# Patient Record
Sex: Female | Born: 1980 | Race: Black or African American | Hispanic: No | Marital: Married | Smoking: Never smoker
Health system: Southern US, Community
[De-identification: ages and names within clinical notes are randomized; demographics above are authoritative.]

## PROBLEM LIST (undated history)

## (undated) DIAGNOSIS — E079 Disorder of thyroid, unspecified: Secondary | ICD-10-CM

## (undated) HISTORY — PX: ABDOMINAL HYSTERECTOMY: SHX81

## (undated) HISTORY — PX: TUBAL LIGATION: SHX77

## (undated) HISTORY — DX: Disorder of thyroid, unspecified: E07.9

## (undated) HISTORY — PX: BREAST SURGERY: SHX581

---

## 2000-03-15 ENCOUNTER — Ambulatory Visit (HOSPITAL_BASED_OUTPATIENT_CLINIC_OR_DEPARTMENT_OTHER): Admission: RE | Admit: 2000-03-15 | Discharge: 2000-03-15 | Payer: Self-pay | Admitting: Plastic Surgery

## 2000-03-15 ENCOUNTER — Encounter (INDEPENDENT_AMBULATORY_CARE_PROVIDER_SITE_OTHER): Payer: Self-pay | Admitting: *Deleted

## 2009-12-07 ENCOUNTER — Emergency Department (HOSPITAL_BASED_OUTPATIENT_CLINIC_OR_DEPARTMENT_OTHER): Admission: EM | Admit: 2009-12-07 | Discharge: 2009-12-07 | Payer: Self-pay | Admitting: Emergency Medicine

## 2009-12-07 ENCOUNTER — Emergency Department (HOSPITAL_COMMUNITY): Admission: EM | Admit: 2009-12-07 | Discharge: 2009-12-07 | Payer: Self-pay | Admitting: Emergency Medicine

## 2009-12-07 ENCOUNTER — Ambulatory Visit: Payer: Self-pay | Admitting: Vascular Surgery

## 2010-08-22 NOTE — Op Note (Signed)
[place]  Patient:    Katherine Reese, Katherine Reese                       MRN: [id number] Proc. Date: 03/15/00 Adm. Date:  [id number] Attending:  Gustavus Messing                           Operative Report  INDICATIONS:  A 30 year old lady who was admitted to hospital surgical center today for reduction of breasts.  She has severe macromastia, back and shoulder pain secondary to large pendulous breasts.  SURGEON:  Yaakov Guthrie. Shon Hough, M.D.  ASSISTANT:  Margaretha Sheffield, R.N.  PROCEDURE:  The patient was set up and drawn for the inferior pedicle reduction mammoplasty, remarking the nipple areolar complexes from over 40 cm up to 20.  She underwent general anesthesia, intubated orally.  Prep was done to the chest and breast areas in the routine fashion using Betadine soap and solution, walled off with sterile towels, and draped so as to make a sterile field.  Xylocaine 0.25% was injected locally, 150 cc per side.  The wounds were scored with a #15 blade then the skin over the inferior pedicle was de-epithelialized with a #20 blade out laterally.  The mediolateral flaps were taken off down to the fascia.  The new keyhole area was also debulked.  Excess breast tissue and accessory breast tissue was taken off laterally over the serratus anterior latissimus dorsi areas and above the axillary areas.  After proper hemostasis, the flaps were transposed and stayed with 3-0 Prolene. Subcutaneous closure was done with 3-0 Monocryl x 2 layers, and then a running subcuticular stitch of 3.0 Monocryl and 5-0 Monocryl throughout the inverted T.  The wounds were drained with #10 Blake drains which were placed in the depths of the wound above the lateral-most portion of the incision, secured with 3-0 Prolene.  The same procedure was carried out on both sides, removing over 1500 g per side.  At the end of the procedure, the nipple areolar complexes were examined with good  nourishment and blood supply.  The wounds were cleansed.  Steri-Strips and soft dressing were applied, including Xeroform, 4x4s, ABDs, and Hypafix tape.  She withstood the procedures very well, was taken to the recovery room in good condition.  ESTIMATED BLOOD LOSS:  100 cc.  COMPLICATIONS:  None. DD:  03/15/00 TD:  03/15/00 Job: [id number]

## 2014-04-24 ENCOUNTER — Ambulatory Visit (HOSPITAL_BASED_OUTPATIENT_CLINIC_OR_DEPARTMENT_OTHER)
Admission: RE | Admit: 2014-04-24 | Discharge: 2014-04-24 | Disposition: A | Discharge status: Home / Self Care | Payer: Managed Care, Other (non HMO) | Source: Ambulatory Visit | Attending: Physician Assistant | Admitting: Physician Assistant

## 2014-04-24 ENCOUNTER — Other Ambulatory Visit: Payer: Self-pay | Admitting: Physician Assistant

## 2014-04-24 ENCOUNTER — Ambulatory Visit (INDEPENDENT_AMBULATORY_CARE_PROVIDER_SITE_OTHER): Payer: Managed Care, Other (non HMO)

## 2014-04-24 ENCOUNTER — Encounter: Payer: Self-pay | Admitting: Physician Assistant

## 2014-04-24 ENCOUNTER — Ambulatory Visit (INDEPENDENT_AMBULATORY_CARE_PROVIDER_SITE_OTHER): Payer: Managed Care, Other (non HMO) | Admitting: Physician Assistant

## 2014-04-24 VITALS — BP 104/70 | HR 77 | Temp 98.7°F | Ht 64.0 in | Wt 241.0 lb

## 2014-04-24 DIAGNOSIS — M79673 Pain in unspecified foot: Secondary | ICD-10-CM | POA: Insufficient documentation

## 2014-04-24 DIAGNOSIS — M79671 Pain in right foot: Secondary | ICD-10-CM

## 2014-04-24 DIAGNOSIS — K219 Gastro-esophageal reflux disease without esophagitis: Secondary | ICD-10-CM | POA: Insufficient documentation

## 2014-04-24 DIAGNOSIS — B001 Herpesviral vesicular dermatitis: Secondary | ICD-10-CM | POA: Insufficient documentation

## 2014-04-24 MED ORDER — MELOXICAM 15 MG PO TABS: 15.0000 mg | ORAL_TABLET | Freq: Every day | ORAL | Status: DC

## 2014-04-24 NOTE — Progress Notes (Signed)
   Subjective:    Patient ID: Katherine Reese, female    DOB: 05-03-1980, 34 y.o.   MRN: [id number]  HPI  Patient is a 34 year old female who presents to the clinic to establish care.  .. Active Ambulatory Problems    Diagnosis Date Noted  . Fever blister 04/24/2014  . GERD (gastroesophageal reflux disease) 04/24/2014  . Heel pain 04/24/2014   Resolved Ambulatory Problems    Diagnosis Date Noted  . No Resolved Ambulatory Problems   Past Medical History  Diagnosis Date  . Thyroid disease    .Marland Kitchen. Family History  Problem Relation Age of Onset  . Hyperlipidemia Mother   . Diabetes Maternal Grandmother   . Hyperlipidemia Maternal Grandmother   . Kidney disease Maternal Grandmother   . Diabetes Maternal Grandfather   . Hyperlipidemia Maternal Grandfather   . Kidney disease Maternal Grandfather    .Marland Kitchen. History   Social History  . Marital Status: Married    Spouse Name: N/A    Number of Children: N/A  . Years of Education: N/A   Occupational History  . Not on file.   Social History Main Topics  . Smoking status: Never Smoker   . Smokeless tobacco: Never Used  . Alcohol Use: No  . Drug Use: No  . Sexual Activity: Yes   Other Topics Concern  . Not on file   Social History Narrative  . No narrative on file   Patient does not need refills on her ongoing medical conditions of GERD and fever blisters.  She does have one concern today with right heel pain. This is been ongoing for over a year. Some days it is worse than others. She has tried icing and and ibuprofen with little to no relief. Nothing in particular makes worse. Nothing specific makes better. She denies any injury. She denies any calf pain. She denies any pain over the fascia. She did have some anterior foot pain at one time but it has since resolved.    Review of Systems  All other systems reviewed and are negative.      Objective:   Physical Exam  Constitutional: She is oriented to person, place, and  time. She appears well-developed and well-nourished.  HENT:  Head: Normocephalic and atraumatic.  Cardiovascular: Normal rate, regular rhythm and normal heart sounds.   Pulmonary/Chest: Effort normal and breath sounds normal.  Musculoskeletal:  Normal ROM and strength of right foot.  No pain over anterior foot.  No pain over right fascia. Pain to palpation over right heel. No pain over Achilles tendon and up right calf.   Neurological: She is alert and oriented to person, place, and time.  Skin: Skin is dry.  Psychiatric: She has a normal mood and affect. Her behavior is normal.          Assessment & Plan:  Fever blisters-controlled with antiviral daily.  GERD-controlled with Nexium daily.  Right help pain-I suspect he'll spur to be causing pain. Will get x-ray today. I do think patient would benefit from orthotics. If x-ray confirms spur we can refer to Dr. Karie Schwalbe for orthotics. I did give patient a stronger anti-inflammatory Modic to take as needed for inflammation. I did give a handout. Encouraged patient to ice. Will follow-up.

## 2014-04-24 NOTE — Patient Instructions (Signed)
Heel Spur A heel spur is a hook of bone that can form on the calcaneus (the heel bone and the largest bone of the foot). Heel spurs are often associated with plantar fasciitis and usually come in people who have had the problem for an extended period of time. The cause of the relationship is unknown. The pain associated with them is thought to be caused by an inflammation (soreness and redness) of the plantar fascia rather than the spur itself. The plantar fascia is a thick fibrous like tissue that runs from the calcaneus (heel bone) to the ball of the foot. This strong, tight tissue helps maintain the arch of your foot. It helps distribute the weight across your foot as you walk or run. Stresses placed on the plantar fascia can be tremendous. When it is inflamed normal activities become painful. Pain is worse in the morning after sleeping. After sleeping the plantar fascia is tight. The first movements stretch the fascia and this causes pain. As the tendon loosens, the pain usually gets better. It often returns with too much standing or walking.  About 70% of patients with plantar fasciitis have a heel spur. About half of people without foot pain also have heel spurs. DIAGNOSIS  The diagnosis of a heel spur is made by X-ray. The X-ray shows a hook of bone protruding from the bottom of the calcaneus at the point where the plantar fascia is attached to the heel bone.  TREATMENT  It is necessary to find out what is causing the stretching of the plantar fascia. If the cause is over-pronation (flat feet), orthotics and proper foot ware may help.  Stretching exercises, losing weight, wearing shoes that have a cushioned heel that absorbs shock, and elevating the heel with the use of a heel cradle, heel cup, or orthotics may all help. Heel cradles and heel cups provide extra comfort and cushion to the heel, and reduce the amount of shock to the sore area. AVOIDING THE PAIN OF PLANTAR FASCIITIS AND HEEL  SPURS  Consult a sports medicine professional before beginning a new exercise program.  Walking programs offer a good workout. There is a lower chance of overuse injuries common to the runners. There is less impact and less jarring of the joints.  Begin all new exercise programs slowly. If problems or pains develop, decrease the amount of time or distance until you are at a comfortable level.  Wear good shoes and replace them regularly.  Stretch your foot and the heel cords at the back of the ankle (Achilles tendons) both before and after exercise.  Run or exercise on even surfaces that are not hard. For example, asphalt is better than pavement.  Do not run barefoot on hard surfaces.  If using a treadmill, vary the incline.  Do not continue to workout if you have foot or joint problems. Seek professional help if they do not improve. HOME CARE INSTRUCTIONS   Avoid activities that cause you pain until you recover.  Use ice or cold packs to the problem or painful areas after working out.  Only take over-the-counter or prescription medicines for pain, discomfort, or fever as directed by your caregiver.  Soft shoe inserts or athletic shoes with air or gel sole cushions may be helpful.  If problems continue or become more severe, consult a sports medicine caregiver. Cortisone is a potent anti-inflammatory medication that may be injected into the painful area. You can discuss this treatment with your caregiver. MAKE SURE YOU:     Understand these instructions.  Will watch your condition.  Will get help right away if you are not doing well or get worse. Document Released: 04/29/2005 Document Revised: 06/15/2011 Document Reviewed: 05/24/2013 ExitCare Patient Information 2015 [employer]. This information is not intended to replace advice given to you by your health care provider. Make sure you discuss any questions you have with your health care provider.  

## 2014-04-25 ENCOUNTER — Encounter: Payer: Self-pay | Admitting: Physician Assistant

## 2014-04-25 DIAGNOSIS — M722 Plantar fascial fibromatosis: Secondary | ICD-10-CM | POA: Insufficient documentation

## 2014-05-04 ENCOUNTER — Telehealth: Payer: Self-pay | Admitting: *Deleted

## 2014-05-04 NOTE — Telephone Encounter (Signed)
Pt left vm stating that the meloxicam has given her bad diarrhea.  Not taken in 2 days & diarrhea is better.

## 2014-05-07 NOTE — Telephone Encounter (Signed)
Patient advised and transferred to the front for scheduling.

## 2014-05-07 NOTE — Telephone Encounter (Signed)
Ok. Can use aleve or ibuprofen OTC as well. We had mentioned othrothics from Dr. Karie Schwalbe. See if she would like to schedule appt.

## 2014-05-15 ENCOUNTER — Ambulatory Visit (INDEPENDENT_AMBULATORY_CARE_PROVIDER_SITE_OTHER): Payer: Managed Care, Other (non HMO) | Admitting: Sports Medicine

## 2014-05-15 ENCOUNTER — Encounter: Payer: Self-pay | Admitting: Sports Medicine

## 2014-05-15 DIAGNOSIS — M722 Plantar fascial fibromatosis: Secondary | ICD-10-CM

## 2014-05-15 MED ORDER — DICLOFENAC SODIUM 75 MG PO TBEC
75.0000 mg | DELAYED_RELEASE_TABLET | Freq: Two times a day (BID) | ORAL | Status: DC
Start: 2014-05-15 — End: 2016-05-29

## 2014-05-15 NOTE — Assessment & Plan Note (Signed)
Custom orthotics as above. Home rehabilitation exercises given, switching to Voltaren. Return in a month, injection if no better.

## 2014-05-15 NOTE — Progress Notes (Signed)
   Subjective:    I'm seeing this patient as a consultation for:  Tandy GawJade Breeback, PA-C  CC: right foot pain  HPI: For sometime now this pleasant 34 year old female has had pain that she localizes on the plantar aspect of her right foot, worse with the first few steps in the morning, moderate, persistent without radiation. She was referred to me for further evaluation and definitive treatment. She did not have a good response to meloxicam, eventually had some diarrhea.  Past medical history, Surgical history, Family history not pertinant except as noted below, Social history, Allergies, and medications have been entered into the medical record, reviewed, and no changes needed.   Review of Systems: No headache, visual changes, nausea, vomiting, diarrhea, constipation, dizziness, abdominal pain, skin rash, fevers, chills, night sweats, weight loss, swollen lymph nodes, body aches, joint swelling, muscle aches, chest pain, shortness of breath, mood changes, visual or auditory hallucinations.   Objective:   General: Well Developed, well nourished, and in no acute distress.  Neuro/Psych: Alert and oriented x3, extra-ocular muscles intact, able to move all 4 extremities, sensation grossly intact. Skin: Warm and dry, no rashes noted.  Respiratory: Not using accessory muscles, speaking in full sentences, trachea midline.  Cardiovascular: Pulses palpable, no extremity edema. Abdomen: Does not appear distended. Right Foot: No visible erythema or swelling. Range of motion is full in all directions. Strength is 5/5 in all directions. No hallux valgus. No pes cavus or pes planus. No abnormal callus noted. No pain over the navicular prominence, or base of fifth metatarsal. tender to palpation of the calcaneal insertion of plantar fascia. No pain at the Achilles insertion. No pain over the calcaneal bursa. No pain of the retrocalcaneal bursa. No tenderness to palpation over the tarsals, metatarsals,  or phalanges. No hallux rigidus or limitus. No tenderness palpation over interphalangeal joints. No pain with compression of the metatarsal heads. Neurovascularly intact distally.  Patient was fitted for a : standard, cushioned, semi-rigid orthotic. The orthotic was heated and afterward the patient stood on the orthotic blank positioned on the orthotic stand. The patient was positioned in subtalar neutral position and 10 degrees of ankle dorsiflexion in a weight bearing stance. After completion of molding, a stable base was applied to the orthotic blank. The blank was ground to a stable position for weight bearing. Size: 9 Base: [facility] Additional Posting and Padding: None The patient ambulated these, and they were very comfortable.  Impression and Recommendations:   This case required medical decision making of moderate complexity.

## 2014-06-12 ENCOUNTER — Ambulatory Visit: Payer: Managed Care, Other (non HMO) | Admitting: Sports Medicine

## 2016-05-18 ENCOUNTER — Encounter: Payer: Managed Care, Other (non HMO) | Admitting: Podiatry

## 2016-05-29 ENCOUNTER — Ambulatory Visit (INDEPENDENT_AMBULATORY_CARE_PROVIDER_SITE_OTHER): Payer: Managed Care, Other (non HMO)

## 2016-05-29 ENCOUNTER — Encounter: Payer: Self-pay | Admitting: Podiatry

## 2016-05-29 ENCOUNTER — Ambulatory Visit (INDEPENDENT_AMBULATORY_CARE_PROVIDER_SITE_OTHER): Payer: Managed Care, Other (non HMO) | Admitting: Podiatry

## 2016-05-29 VITALS — Resp 16 | Ht 63.0 in | Wt 249.0 lb

## 2016-05-29 DIAGNOSIS — M21619 Bunion of unspecified foot: Secondary | ICD-10-CM | POA: Diagnosis not present

## 2016-05-29 DIAGNOSIS — M722 Plantar fascial fibromatosis: Secondary | ICD-10-CM | POA: Diagnosis not present

## 2016-05-29 MED ORDER — TRIAMCINOLONE ACETONIDE 10 MG/ML IJ SUSP
10.0000 mg | Freq: Once | INTRAMUSCULAR | Status: AC
  Administered 2016-05-29: 10 mg

## 2016-05-29 NOTE — Patient Instructions (Addendum)
Pre-Operative Instructions  Congratulations, you have decided to take an important step to improving your quality of life.  You can be assured that the doctors of [facility] will be with you every step of the way.  1. Plan to be at the surgery center/hospital at least 1 (one) hour prior to your scheduled time unless otherwise directed by the surgical center/hospital staff.  You must have a responsible adult accompany you, remain during the surgery and drive you home.  Make sure you have directions to the surgical center/hospital and know how to get there on time. 2. For hospital based surgery you will need to obtain a history and physical form from your family physician within 1 month prior to the date of surgery- we will give you a form for you primary physician.  3. We make every effort to accommodate the date you request for surgery.  There are however, times where surgery dates or times have to be moved.  We will contact you as soon as possible if a change in schedule is required.   4. No Aspirin/Ibuprofen for one week before surgery.  If you are on aspirin, any non-steroidal anti-inflammatory medications (Mobic, Aleve, Ibuprofen) you should stop taking it 7 days prior to your surgery.  You make take Tylenol  For pain prior to surgery.  5. Medications- If you are taking daily heart and blood pressure medications, seizure, reflux, allergy, asthma, anxiety, pain or diabetes medications, make sure the surgery center/hospital is aware before the day of surgery so they may notify you which medications to take or avoid the day of surgery. 6. No food or drink after midnight the night before surgery unless directed otherwise by surgical center/hospital staff. 7. No alcoholic beverages 24 hours prior to surgery.  No smoking 24 hours prior to or 24 hours after surgery. 8. Wear loose pants or shorts- loose enough to fit over bandages, boots, and casts. 9. No slip on shoes, sneakers are best. 10. Bring  your boot with you to the surgery center/hospital.  Also bring crutches or a walker if your physician has prescribed it for you.  If you do not have this equipment, it will be provided for you after surgery. 11. If you have not been contracted by the surgery center/hospital by the day before your surgery, call to confirm the date and time of your surgery. 12. Leave-time from work may vary depending on the type of surgery you have.  Appropriate arrangements should be made prior to surgery with your employer. 13. Prescriptions will be provided immediately following surgery by your doctor.  Have these filled as soon as possible after surgery and take the medication as directed. 14. Remove nail polish on the operative foot. 15. Wash the night before surgery.  The night before surgery wash the foot and leg well with the antibacterial soap provided and water paying special attention to beneath the toenails and in between the toes.  Rinse thoroughly with water and dry well with a towel.  Perform this wash unless told not to do so by your physician.  Enclosed: 1 Ice pack (please put in freezer the night before surgery)   1 Hibiclens skin cleaner   Pre-op Instructions  If you have any questions regarding the instructions, do not hesitate to call our office.  [place]: [address] [place], [place] [address] [phone]  [place]: [address] [place], [place] [address] [phone]  [place]: [address]  [place], [place] [address] [phone]   Dr.   Cristie Hem DPM, Dr. Ovid Curd DPM, Dr. Ardelle Anton DPM, Dr. Asencion Islam DPM    Bunionectomy A bunionectomy is a surgical procedure to remove a bunion. A bunion is a visible bump of bone on the inside of your foot where your big toe meets the rest of your foot. A bunion can develop when pressure turns this bone (first metatarsal) toward the other toes. Shoes that are too tight are the most common cause of bunions. Bunions can also be caused  by diseases, such as arthritis and polio. You may need a bunionectomy if your bunion is very large and painful or it affects your ability to walk. Tell a health care provider about:  Any allergies you have.  All medicines you are taking, including vitamins, herbs, eye drops, creams, and over-the-counter medicines.  Any problems you or family members have had with anesthetic medicines.  Any blood disorders you have.  Any surgeries you have had.  Any medical conditions you have. What are the risks? Generally, this is a safe procedure. However, problems may occur, including:  Infection.  Pain.  Nerve damage.  Bleeding or blood clots.  Reactions to medicines.  Numbness, stiffness, or arthritis in your toe.  Foot problems that continue even after the procedure. What happens before the procedure?  Ask your health care provider about:  Changing or stopping your regular medicines. This is especially important if you are taking diabetes medicines or blood thinners.  Taking medicines such as aspirin and ibuprofen. These medicines can thin your blood. Do not take these medicines before your procedure if your health care provider instructs you not to.  Do not drink alcohol before the procedure as directed by your health care provider.  Do not use tobacco products, including cigarettes, chewing tobacco, or electronic cigarettes, before the procedure as directed by your health care provider. If you need help quitting, ask your health care provider.  Ask your health care provider what kind of medicine you will be given during your procedure. A bunionectomy may be done using one of these:  A medicine that numbs the area (local anesthetic).  A medicine that makes you go to sleep (general anesthetic). If you will be given general anesthetic, do not eat or drink anything after midnight on the night before the procedure or as directed by your health care provider. What happens during the  procedure?  An IV tube may be inserted into a vein.  You will be given local anesthetic or general anesthetic.  The surgeon will make a cut (incision) over the enlarged area at the first joint of the big toe. The surgeon will remove the bunion.  You may have more than one incision if any of the bones in your big toe need to be moved. A bone itself may need to be cut.  Sometimes the tissues around the big toe may also need to be cut then tightened or loosened to reposition the toe.  Screws or other hardware may be used to keep your foot in thecorrect position.  The incision will be closed with stitches (sutures) and covered with adhesive strips or another type of bandage (dressing). What happens after the procedure?  You may spend some time in a recovery area.  Your blood pressure, heart rate, breathing rate, and blood oxygen level will be monitored often until the medicines you were given have worn off. This information is not intended to replace advice given to you by your health care provider. Make sure  you discuss any questions you have with your health care provider. Document Released: 03/06/2005 Document Revised: 08/29/2015 Document Reviewed: 11/08/2013 Elsevier Interactive Patient Education  2017 [employer]

## 2016-05-29 NOTE — Progress Notes (Signed)
   Subjective:    Patient ID: Katherine Reese, female    DOB: 1981/01/23, 36 y.o.   MRN: [id number]  HPI  Chief Complaint  Patient presents with  . Bunions    BL; Pt states that the bunions have been painful since 05/2014.        Review of Systems     Objective:   Physical Exam        Assessment & Plan:

## 2016-05-29 NOTE — Progress Notes (Signed)
This encounter was created in error - please disregard.

## 2016-05-30 NOTE — Progress Notes (Signed)
Subjective:     Patient ID: Katherine Reese, female   DOB: 1981/03/21, 36 y.o.   MRN: [id number]  HPI patient states that she's had bunion deformity for a long time and over the last 2 years that gotten worse. States she's tried wider shoes she's tried padding the area and soak without relief of symptoms and the left hurts more than the right. I also went ahead and discussed with her her chronic heel pain right over left that she's had previous orthotics but it continues to be aggravating   Review of Systems  All other systems reviewed and are negative.      Objective:   Physical Exam  Constitutional: She is oriented to person, place, and time.  Cardiovascular: Intact distal pulses.   Musculoskeletal: Normal range of motion.  Neurological: She is oriented to person, place, and time.  Skin: Skin is warm.  Nursing note and vitals reviewed.  neurovascular status found to be intact muscle strength is adequate range of motion within normal limits with patient noted to have a large hyperostosis medial aspect first metatarsal head left over right that's red and painful when pressed and is noted to have pain in the plantar heel region right at the insertional point of the tendon into the calcaneus. 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 Patient wants surgery on the left foot and has been thinking about this for a long time and cannot take the pain anymore and at this time I did allow her to read a consent form reviewing correction going over alternative treatments complications that are listed. Patient is comfortable with this and after extensive review signs consent  form is given all preoperative instructions. I did dispense air fracture walker with instructions on usage and explained that recovery is a proximally 6 months for this type procedure with no long-term guarantees. For the right heel I went ahead and injected the plantar fascia 3 mg Kenalog 5 mg Xylocaine and applied fascial brace and discussed possible injection at the surgery for the left foot if it remains persistent  X-ray report indicated elevation of the intermetatarsal angle left of approximately 15 and 13 on the right

## 2016-06-23 ENCOUNTER — Encounter: Payer: Self-pay | Admitting: Podiatry

## 2016-06-23 DIAGNOSIS — M2012 Hallux valgus (acquired), left foot: Secondary | ICD-10-CM | POA: Diagnosis not present

## 2016-06-24 ENCOUNTER — Telehealth: Payer: Self-pay | Admitting: *Deleted

## 2016-06-24 MED ORDER — HYDROMORPHONE HCL 4 MG PO TABS: 4.0000 mg | ORAL_TABLET | ORAL | 0 refills | Status: DC | PRN

## 2016-06-24 NOTE — Telephone Encounter (Signed)
Called patient at [phone] (Cell #) to let them know that Dr. Charlsie Merlesegal switched them to Dilaudid 4 mg, dispensed #30 with no refill. I told patient that Dr. Charlsie Merlesegal recommended them to elevate their foot and take off their ace wrap. Pt stated understanding.  Pt also stated that her husband would pick up their prescription today.

## 2016-07-01 ENCOUNTER — Telehealth: Payer: Self-pay | Admitting: *Deleted

## 2016-07-01 NOTE — Telephone Encounter (Addendum)
Pt states she has an appt for POV tomorrow, but got the dressing wet today. Left message instructing pt that if the dressing was only wet on the outside to blow the area dry with a hair dryer, but if soaking wet we would need to change the dressing today, and to please let us know the condition of her dressing and if she would like to come in today.07/13/2016-Pt request return to work release. 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 I told pt I would contact her with Dr. Beverlee Nimsegal's decision concerning her return to work and pt states she would like to pick up the note at our office.07/15/2016-Dr. Regal states pt may return to seated duties in the surgical boot until she transfers to surgical shoe then athletic shoe or work shoe, beginning 07/16/2016. Pt states she would like the note faxed to [phone] ATTN: Leone Brandina Decker. Letter faxed to ATTN: Leone Brandina Decker.

## 2016-07-02 ENCOUNTER — Ambulatory Visit: Payer: Managed Care, Other (non HMO) | Admitting: Podiatry

## 2016-07-02 ENCOUNTER — Encounter: Payer: Self-pay | Admitting: Podiatry

## 2016-07-02 ENCOUNTER — Ambulatory Visit (INDEPENDENT_AMBULATORY_CARE_PROVIDER_SITE_OTHER): Payer: Managed Care, Other (non HMO)

## 2016-07-02 VITALS — BP 112/78 | HR 78 | Temp 99.1°F

## 2016-07-02 DIAGNOSIS — M21619 Bunion of unspecified foot: Secondary | ICD-10-CM

## 2016-07-02 DIAGNOSIS — Z9889 Other specified postprocedural states: Secondary | ICD-10-CM

## 2016-07-02 NOTE — Progress Notes (Signed)
Subjective:     Patient ID: Katherine RochAmanda L Reese, female   DOB: 11/08/1980, 36 y.o.   MRN: [id number]  HPI patient states she's doing well with the left bunion and is walking around with minimal discomfort or swelling   Review of Systems     Objective:   Physical Exam Neurovascular status intact negative Homans sign noted with well-healed surgical site left wound edges well coapted hallux in rectus position    Assessment:     Doing well post Austin-type osteotomy left with good fixation noted    Plan:     X-ray reviewed and allow patient to return to soft shoes with instructions by patient. Patient will start wearing soft shoes in about 2 weeks and had compression stocking reapplied   X-ray indicates osteotomy is healing well pins are in place no signs of movement

## 2016-07-09 ENCOUNTER — Telehealth: Payer: Self-pay | Admitting: Podiatry

## 2016-07-09 NOTE — Telephone Encounter (Signed)
Pt called asking for a release to go back to work. Pt is wanting to go back next Tuesday.pt had surgery 3.20.18.I told pt Dr R is out of the office until next Monday

## 2016-07-15 ENCOUNTER — Encounter: Payer: Self-pay | Admitting: *Deleted

## 2016-07-23 ENCOUNTER — Ambulatory Visit (INDEPENDENT_AMBULATORY_CARE_PROVIDER_SITE_OTHER): Payer: Managed Care, Other (non HMO) | Admitting: Podiatry

## 2016-07-23 ENCOUNTER — Ambulatory Visit (INDEPENDENT_AMBULATORY_CARE_PROVIDER_SITE_OTHER): Payer: Managed Care, Other (non HMO)

## 2016-07-23 ENCOUNTER — Encounter: Payer: Self-pay | Admitting: Podiatry

## 2016-07-23 DIAGNOSIS — M21619 Bunion of unspecified foot: Secondary | ICD-10-CM

## 2016-07-23 DIAGNOSIS — Z9889 Other specified postprocedural states: Secondary | ICD-10-CM

## 2016-07-23 NOTE — Progress Notes (Signed)
Subjective:     Patient ID: Katherine Reese, female   DOB: 24-Jul-1980, 36 y.o.   MRN: [id number]  HPI patient states she's doing well with her left foot with minimal discomfort but admits that she is not bearing shoe gear the way she should   Review of Systems     Objective:   Physical Exam Neurovascular status intact negative Homan sign was noted with patient found to have well coapted incision site left first metatarsal with hallux in rectus position and adequate range of motion    Assessment:     Doing well post osteotomy first metatarsal left    Plan:     X-rayed and advised on the utilization of continued elevation compression and immobilization as needed. Encouraged her to start wearing different types of shoes  X-ray report indicates good healing of the osteotomy pin in place joint congruence

## 2016-09-03 ENCOUNTER — Encounter: Payer: Self-pay | Admitting: Podiatry

## 2016-09-03 ENCOUNTER — Ambulatory Visit (INDEPENDENT_AMBULATORY_CARE_PROVIDER_SITE_OTHER): Payer: Managed Care, Other (non HMO) | Admitting: Podiatry

## 2016-09-03 ENCOUNTER — Ambulatory Visit (INDEPENDENT_AMBULATORY_CARE_PROVIDER_SITE_OTHER): Payer: Managed Care, Other (non HMO)

## 2016-09-03 DIAGNOSIS — M21619 Bunion of unspecified foot: Secondary | ICD-10-CM

## 2016-09-03 DIAGNOSIS — M722 Plantar fascial fibromatosis: Secondary | ICD-10-CM

## 2016-09-03 MED ORDER — TRIAMCINOLONE ACETONIDE 10 MG/ML IJ SUSP
10.0000 mg | Freq: Once | INTRAMUSCULAR | Status: AC
  Administered 2016-09-03: 10 mg

## 2016-09-03 NOTE — Progress Notes (Signed)
Subjective:    Patient ID: Katherine Reese, female   DOB: 36 y.o.   MRN: [id number]   HPI patient states that the left bunion feels great and the right heel has been very sore    ROS      Objective:  Physical Exam neurovascular status intact with exquisite discomfort plantar aspect right heel with left first MPJ doing very well with good alignment noted and good range of motion     Assessment:    Doing well post surgery left with acute plantar fasciitis right     Plan:    Reviewed x-ray of left and injected the right plantar fashion 3 mg Kenalog 5 mill grams Xylocaine and instructed on wearing her brace and wearing supportive shoes. Reappoint to recheck in 4 weeks  X-rays indicate the pins in place joint congruence with no indication of motion

## 2016-09-10 NOTE — Progress Notes (Signed)
1. Austin bunionectomy (cutting and moving bone) with fixation left

## 2016-09-14 IMAGING — CR DG FOOT COMPLETE 3+V*R*
3 series · 3 of 3 positions shown · non-contrast
Comparison: None.

CLINICAL DATA: Initial encounter for intermittent right heel pain
for 1 year duration. Pain is around fourth and fifth metatarsals.

EXAM:
RIGHT FOOT COMPLETE - 3+ VIEW

[view not recorded (1 of 3)]
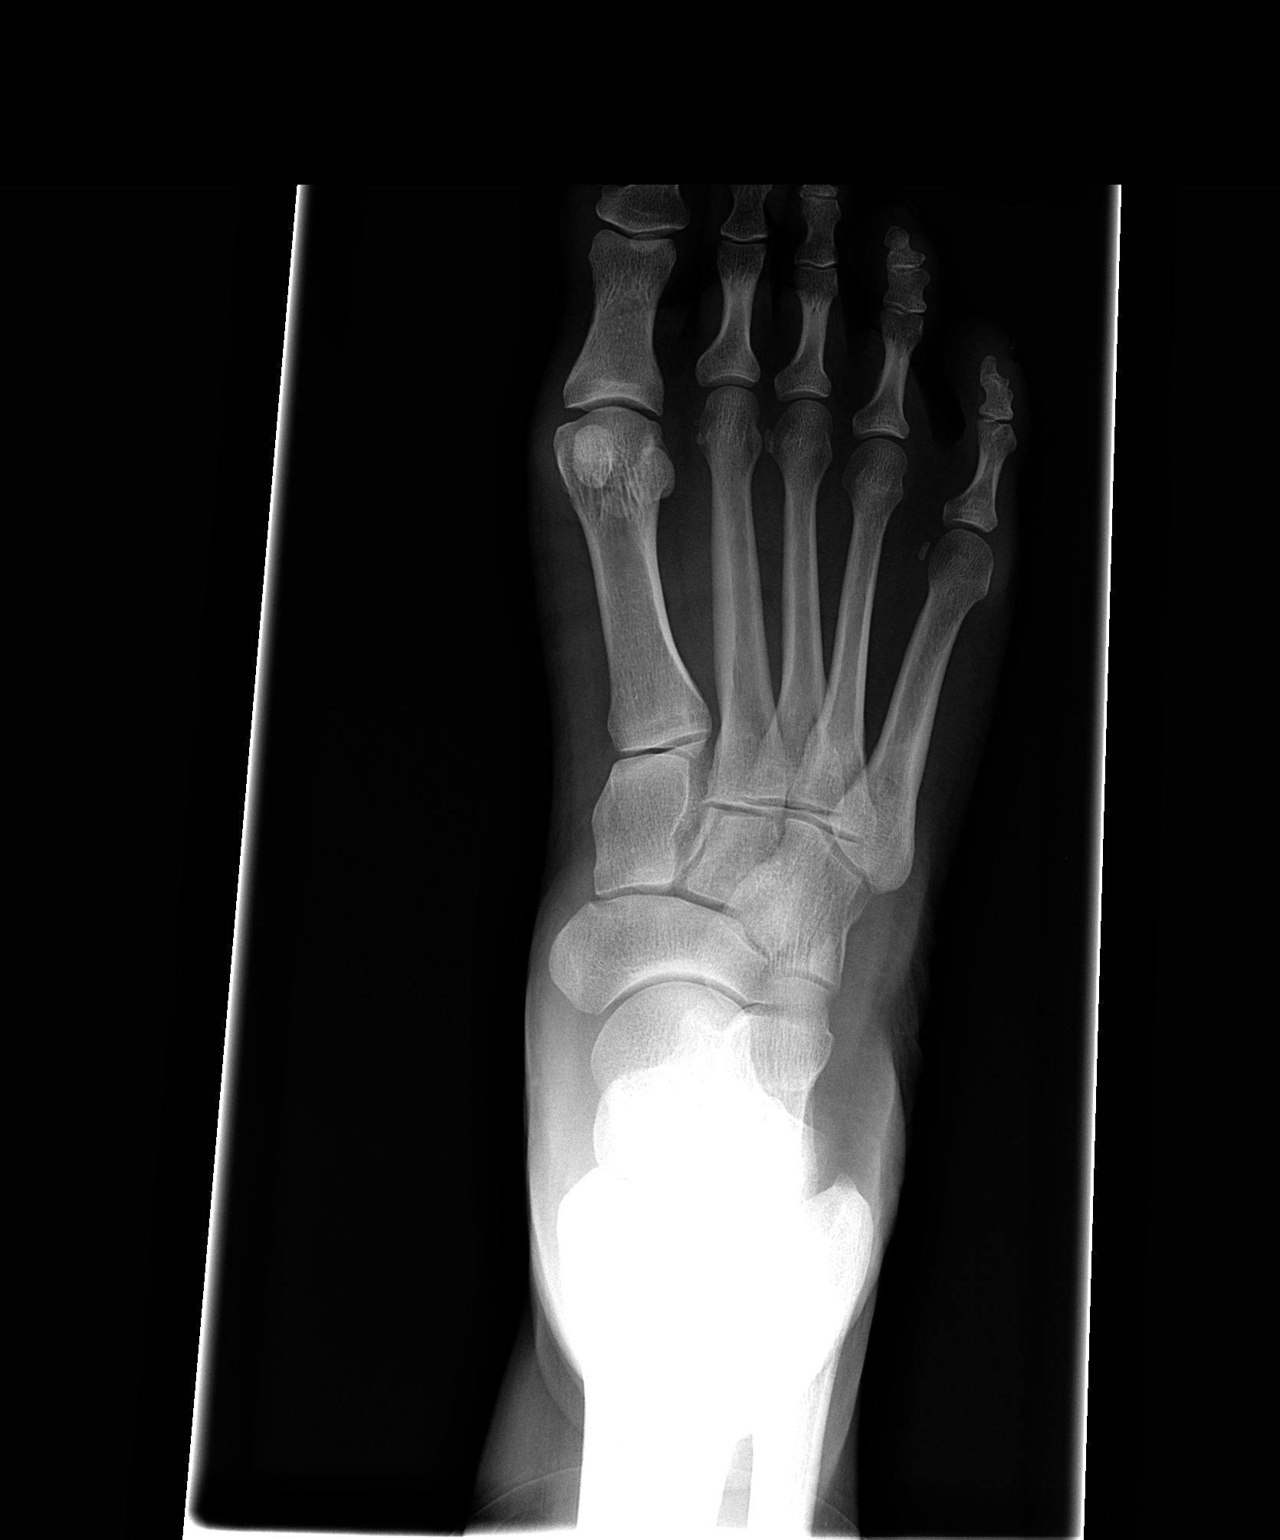

[view not recorded (2 of 3)]
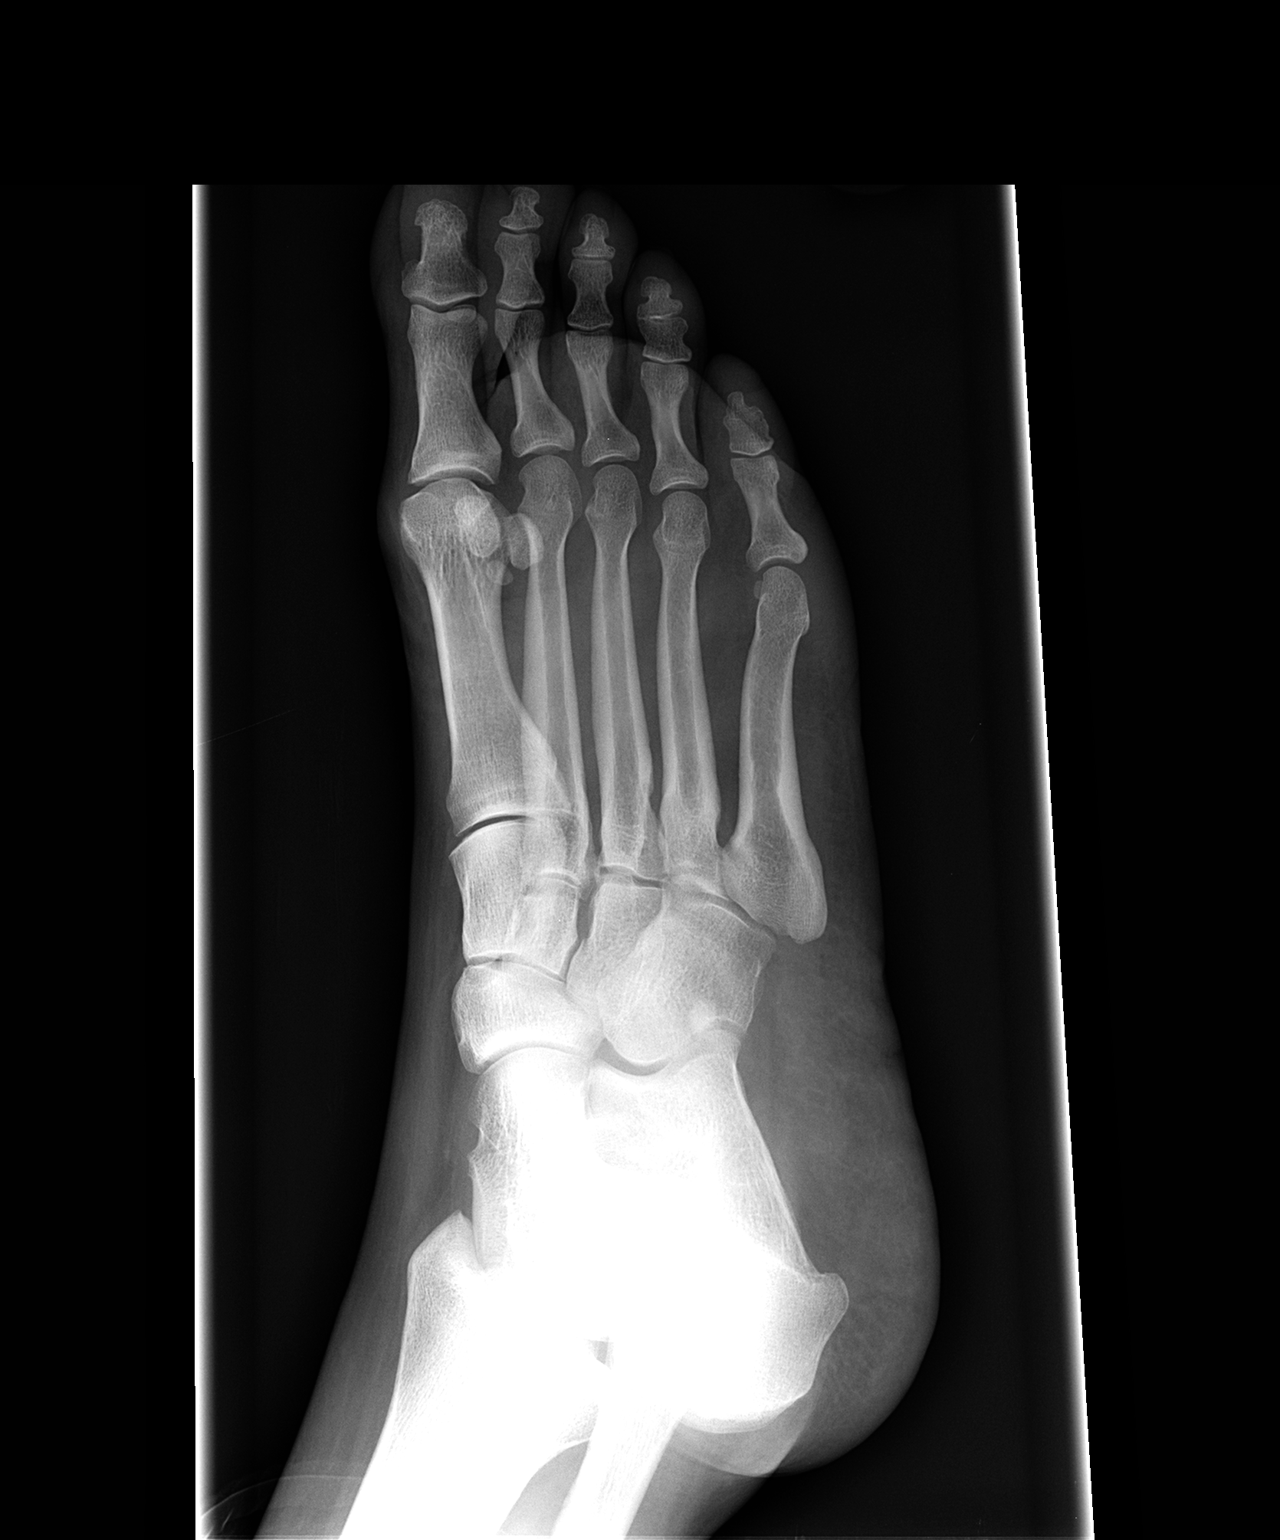

[view not recorded (3 of 3)]
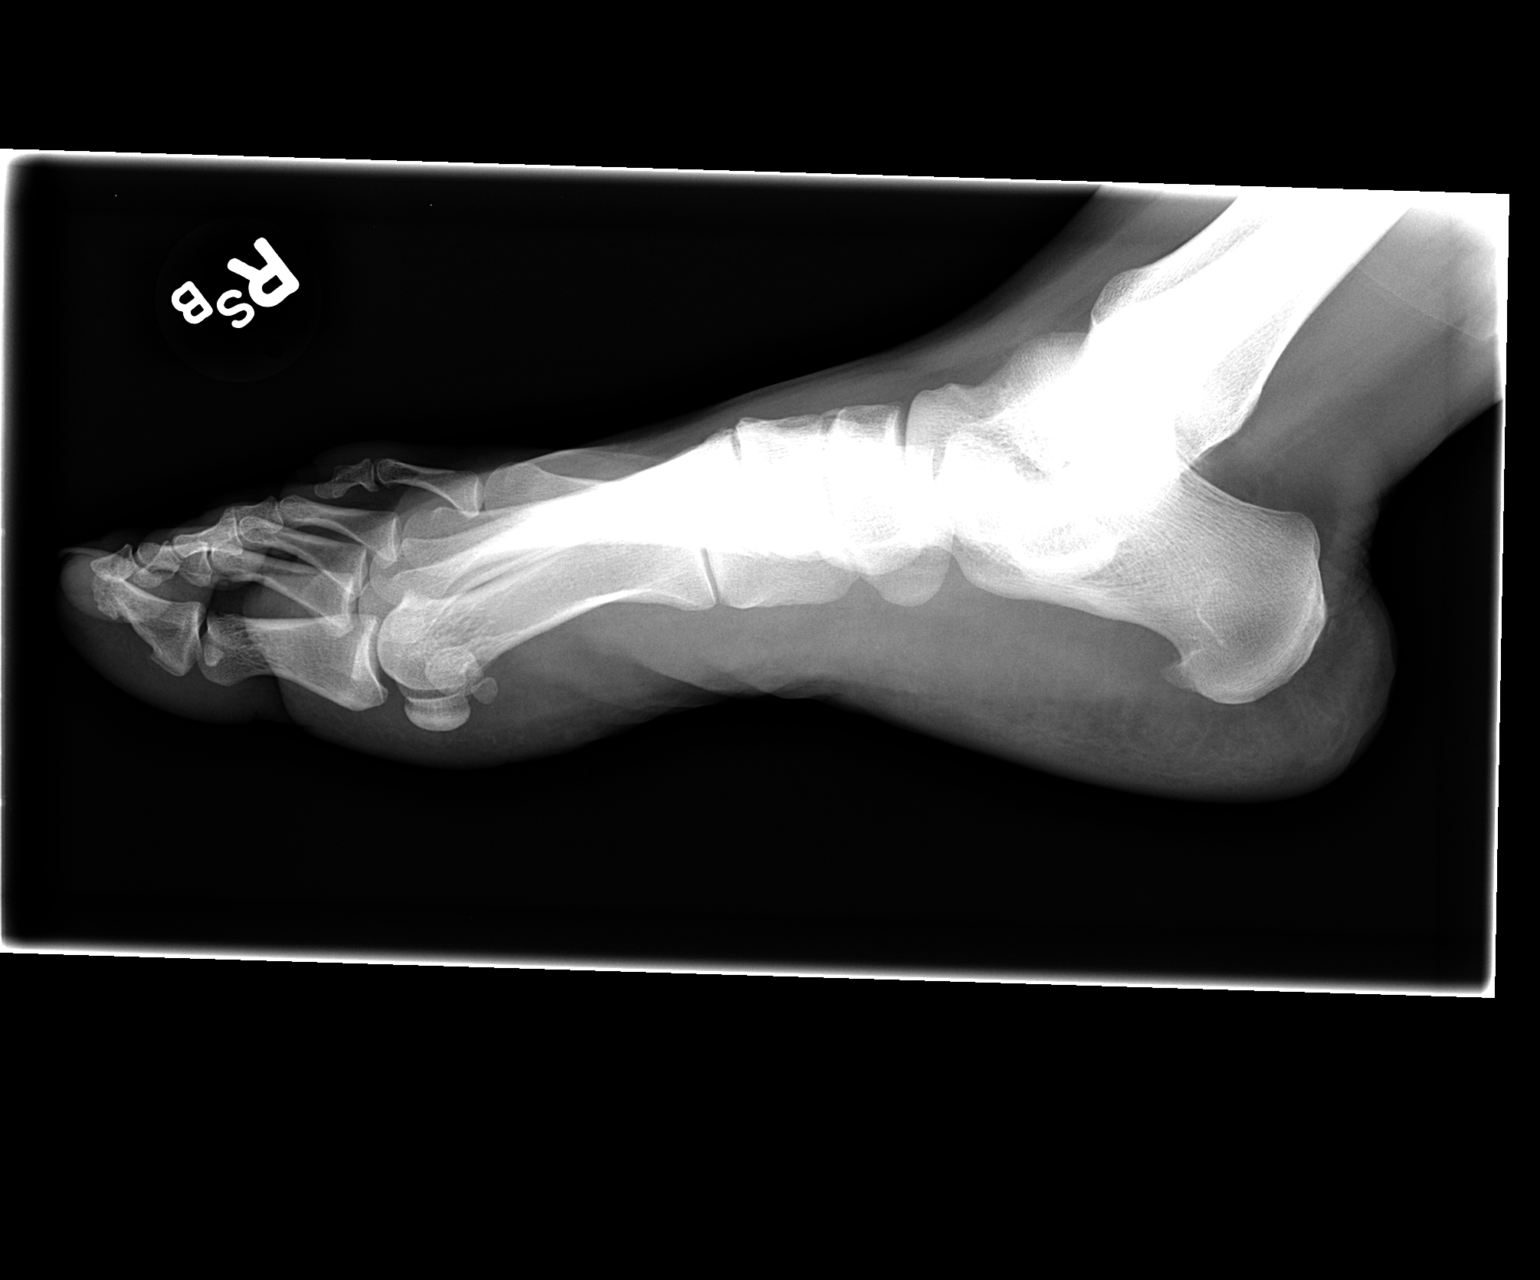

[3 of 3 positions shown; findings below may reference images not displayed]

FINDINGS: There is no evidence of fracture or dislocation. There is no
evidence of arthropathy or other focal bone abnormality. Soft
tissues are unremarkable.
IMPRESSION: Negative.

## 2016-10-01 ENCOUNTER — Ambulatory Visit: Payer: Managed Care, Other (non HMO) | Admitting: Podiatry

## 2016-10-28 ENCOUNTER — Ambulatory Visit: Payer: Managed Care, Other (non HMO) | Admitting: Podiatry

## 2016-11-04 ENCOUNTER — Ambulatory Visit (INDEPENDENT_AMBULATORY_CARE_PROVIDER_SITE_OTHER): Payer: Managed Care, Other (non HMO) | Admitting: Podiatry

## 2016-11-04 ENCOUNTER — Encounter: Payer: Self-pay | Admitting: Podiatry

## 2016-11-04 DIAGNOSIS — M722 Plantar fascial fibromatosis: Secondary | ICD-10-CM | POA: Diagnosis not present

## 2016-11-04 MED ORDER — TRIAMCINOLONE ACETONIDE 10 MG/ML IJ SUSP
10.0000 mg | Freq: Once | INTRAMUSCULAR | Status: AC
  Administered 2016-11-04: 10 mg

## 2016-11-04 NOTE — Progress Notes (Signed)
Subjective:    Patient ID: Katherine Reese, female   DOB: 36 y.o.   MRN: [id number]   HPI patient's heel is still bothering her but improved from previous visit with a reoccurrence over the last couple weeks    ROS      Objective:  Physical Exam neurovascular status intact with patient found to have inflammation discomfort still the plantar heel right at the insertional point of the tendon into the calcaneus     Assessment:   Plantar fasciitis right with inflammation fluid around the medial band that's improved but present      Plan:    Advised on importance of physical therapy shoe gear modification and I did reinject the plantar fascia today 3 mg Kenalog 5 mill grams Xylocaine to reduce the process

## 2016-12-08 ENCOUNTER — Emergency Department (HOSPITAL_BASED_OUTPATIENT_CLINIC_OR_DEPARTMENT_OTHER)
Admission: EM | Admit: 2016-12-08 | Discharge: 2016-12-08 | Disposition: A | Discharge status: Home / Self Care | Payer: Managed Care, Other (non HMO) | Attending: Emergency Medicine | Admitting: Emergency Medicine

## 2016-12-08 ENCOUNTER — Encounter (HOSPITAL_BASED_OUTPATIENT_CLINIC_OR_DEPARTMENT_OTHER): Payer: Self-pay | Admitting: Emergency Medicine

## 2016-12-08 DIAGNOSIS — R55 Syncope and collapse: Secondary | ICD-10-CM | POA: Diagnosis present

## 2016-12-08 DIAGNOSIS — Z79899 Other long term (current) drug therapy: Secondary | ICD-10-CM | POA: Insufficient documentation

## 2016-12-08 DIAGNOSIS — I951 Orthostatic hypotension: Secondary | ICD-10-CM | POA: Diagnosis not present

## 2016-12-08 LAB — COMPREHENSIVE METABOLIC PANEL
ALT: 16 U/L (ref 14–54)
AST: 15 U/L (ref 15–41)
Albumin: 3.4 g/dL — ABNORMAL LOW (ref 3.5–5.0)
Alkaline Phosphatase: 59 U/L (ref 38–126)
Anion gap: 5 (ref 5–15)
BUN: 8 mg/dL (ref 6–20)
CHLORIDE: 107 mmol/L (ref 101–111)
CO2: 25 mmol/L (ref 22–32)
Calcium: 8.4 mg/dL — ABNORMAL LOW (ref 8.9–10.3)
Creatinine, Ser: 0.59 mg/dL (ref 0.44–1.00)
GFR calc non Af Amer: >60 mL/min (ref >60)
Glucose, Bld: 95 mg/dL (ref 65–99)
POTASSIUM: 3.3 mmol/L — AB (ref 3.5–5.1)
Sodium: 137 mmol/L (ref 135–145)
Total Bilirubin: 0.4 mg/dL (ref 0.3–1.2)
Total Protein: 6.5 g/dL (ref 6.5–8.1)

## 2016-12-08 LAB — CBG MONITORING, ED: GLUCOSE-CAPILLARY: 78 mg/dL (ref 65–99)

## 2016-12-08 LAB — CBC
HCT: 37.1 % (ref 36.0–46.0)
Hemoglobin: 11.9 g/dL — ABNORMAL LOW (ref 12.0–15.0)
MCH: 27.4 pg (ref 26.0–34.0)
MCHC: 32.1 g/dL (ref 30.0–36.0)
MCV: 85.3 fL (ref 78.0–100.0)
PLATELETS: 287 10*3/uL (ref 150–400)
RBC: 4.35 MIL/uL (ref 3.87–5.11)
RDW: 13.2 % (ref 11.5–15.5)
WBC: 5.5 10*3/uL (ref 4.0–10.5)

## 2016-12-08 LAB — LIPASE, BLOOD: LIPASE: 20 U/L (ref 11–51)

## 2016-12-08 MED ORDER — SODIUM CHLORIDE 0.9 % IV BOLUS (SEPSIS)
1000.0000 mL | Freq: Once | INTRAVENOUS | Status: AC
  Administered 2016-12-08: 1000 mL via INTRAVENOUS

## 2016-12-08 NOTE — ED Triage Notes (Signed)
Pt states she took castor oil last night for laxative.  Pt did have a few episodes of vomiting after and some diarrhea.  This am, she went to work, [facility] out and she bent over to get something.  When she stood up, she became very lightheaded, dizzy, fuzzy vision and felt like she could pass out.  EMS performed orthostatics and was positive.  IV established and pt received NS 400 cc bolus.

## 2016-12-08 NOTE — Discharge Instructions (Signed)
Contact a health care provider if: °You vomit. °You have diarrhea. °You have a fever for more than 2-3 days. °You feel more thirsty than usual. °You feel weak and tired. °Get help right away if: °You have chest pain. °You have a fast or irregular heartbeat. °You develop numbness in any part of your body. °You cannot move your arms or your legs. °You have trouble speaking. °You become sweaty or feel light-headed. °You faint. °You feel short of breath. °You have trouble staying awake. °You feel confused. °

## 2016-12-08 NOTE — ED Provider Notes (Signed)
[facility] Note   CSN: [id number] Arrival date & time: 12/08/16  0906     History   Chief Complaint Chief Complaint  Patient presents with  . Near Syncope    HPI Katherine Reese is a 36 y.o. female Patient presents to the emergency department with a chief complaint of presyncope. Patient states that yesterday she was having an upset stomach and had some mild nausea. Patient ate dinner. She had been having several days of constipation and prior to eating dinner took a tablespoon of castor oil. Later that evening the patient began feeling more nauseous. She said had several episodes of non-bloody nonbilious vomitus and to profuse watery brown stools. The patient states that she has not vomited since about 11 PM last night. This morning she was able to make a bowel movement which was loose but not watery. She denies abdominal pain. This morning at work the patient reached down to pick something off the floor and when she stood up and felt as if she were going to pass out. She denies racing or skipping in her heart, history of marked anemia, melena or hematochezia. She denies any current nausea or lightheadedness. Prior to arrival the patient had positive orthostatic vital signs and was given 400 mL of fluid. Currently her orthostasis has resolved.  HPI  No past medical history on file.  Patient Active Problem List   Diagnosis Date Noted  . Plantar fasciitis, right 04/25/2014  . Fever blister 04/24/2014  . GERD (gastroesophageal reflux disease) 04/24/2014    Past Surgical History:  Procedure Laterality Date  . ABDOMINAL HYSTERECTOMY     ovaries were left  . BREAST SURGERY    . TUBAL LIGATION      OB History    No data available       Home Medications    Prior to Admission medications   Medication Sig Start Date End Date Taking? Authorizing Provider  phentermine 15 MG capsule Take 15 mg by mouth every morning.   Yes [provider]  esomeprazole  (NEXIUM) 40 MG capsule Take 40 mg by mouth daily at 12 noon.    [provider]    Family History Family History  Problem Relation Age of Onset  . Hyperlipidemia Mother   . Diabetes Maternal Grandmother   . Hyperlipidemia Maternal Grandmother   . Kidney disease Maternal Grandmother   . Diabetes Maternal Grandfather   . Hyperlipidemia Maternal Grandfather   . Kidney disease Maternal Grandfather     Social History Social History  Substance Use Topics  . Smoking status: Never Smoker  . Smokeless tobacco: Never Used  . Alcohol use No     Allergies   Other; Spinach; Mobic [meloxicam]; and Penicillins   Review of Systems Review of Systems Ten systems reviewed and are negative for acute change, except as noted in the HPI.    Physical Exam Updated Vital Signs BP 122/81   Pulse 87   Temp 98.3 F (36.8 C) (Oral)   Resp 14   Ht 5\' 3"  (1.6 m)   Wt 108.4 kg (239 lb)   SpO2 100%   BMI 42.34 kg/m   Physical Exam  Physical Exam  Nursing note and vitals reviewed. Constitutional: She is oriented to person, place, and time. She appears well-developed and well-nourished. No distress.  HENT:  Head: Normocephalic and atraumatic.  Eyes: Conjunctivae normal and EOM are normal. Pupils are equal, round, and reactive to light. No scleral icterus.  Neck:  Normal range of motion.  Cardiovascular: Normal rate, regular rhythm and normal heart sounds.  Exam reveals no gallop and no friction rub.   No murmur heard. Pulmonary/Chest: Effort normal and breath sounds normal. No respiratory distress.  Abdominal: Soft. Bowel sounds are normal. She exhibits no distension and no mass. There is no tenderness. There is no guarding.  Neurological: She is alert and oriented to person, place, and time.  Skin: Skin is warm and dry. She is not diaphoretic.    ED Treatments / Results  Labs (all labs ordered are listed, but only abnormal results are displayed) Labs Reviewed  CBC    URINALYSIS, ROUTINE W REFLEX MICROSCOPIC  COMPREHENSIVE METABOLIC PANEL  LIPASE, BLOOD  CBG MONITORING, ED    EKG  EKG Interpretation None       Radiology No results found.  Procedures Procedures (including critical care time)  Medications Ordered in ED Medications - No data to display   Initial Impression / Assessment and Plan / ED Course  I have reviewed the triage vital signs and the nursing notes.  Pertinent labs & imaging results that were available during my care of the patient were reviewed by me and considered in my medical decision making (see chart for details).     Patient with orthostatic hypotension after volume loss from vomiting and diarrhea. She does have a slightly prolonged PR interval on her EKG however there does not appear to be any pathologic conduction issues like WPW or Brugada on her EKG. Patient did not fully lose consciousness. She is ambulatory in the emergency department and feels improved. I discussed return precautions with the patient. She may follow up with her primary care physician. She appears safe for discharge at this time  Final Clinical Impressions(s) / ED Diagnoses   Final diagnoses:  None    New Prescriptions New Prescriptions   No medications on file     Arthor CaptainHarris, Adisa Litt, PA-C 12/08/16 1116    Jacalyn LefevreHaviland, Julie, MD 12/08/16 1202

## 2017-01-27 ENCOUNTER — Ambulatory Visit (INDEPENDENT_AMBULATORY_CARE_PROVIDER_SITE_OTHER): Payer: Managed Care, Other (non HMO)

## 2017-01-27 ENCOUNTER — Other Ambulatory Visit: Payer: Self-pay | Admitting: Podiatry

## 2017-01-27 ENCOUNTER — Encounter: Payer: Self-pay | Admitting: Podiatry

## 2017-01-27 ENCOUNTER — Ambulatory Visit: Payer: Managed Care, Other (non HMO)

## 2017-01-27 ENCOUNTER — Ambulatory Visit (INDEPENDENT_AMBULATORY_CARE_PROVIDER_SITE_OTHER): Payer: Managed Care, Other (non HMO) | Admitting: Podiatry

## 2017-01-27 DIAGNOSIS — M2011 Hallux valgus (acquired), right foot: Secondary | ICD-10-CM

## 2017-01-27 DIAGNOSIS — M722 Plantar fascial fibromatosis: Secondary | ICD-10-CM | POA: Diagnosis not present

## 2017-01-27 DIAGNOSIS — M21619 Bunion of unspecified foot: Secondary | ICD-10-CM | POA: Diagnosis not present

## 2017-01-27 NOTE — Patient Instructions (Addendum)
Bunion A bunion is a bump on the base of the big toe that forms when the bones of the big toe joint move out of position. Bunions may be small at first, but they often get larger over time. The can make walking painful. What are the causes? A bunion may be caused by:  Wearing narrow or pointed shoes that force the big toe to press against the other toes.  Abnormal foot development that causes the foot to roll inward (pronate).  Changes in the foot that are caused by certain diseases, such as rheumatoid arthritis and polio.  A foot injury.  What increases the risk? The following factors may make you more likely to develop this condition:  Wearing shoes that squeeze the toes together.  Having certain diseases, such as: ? Rheumatoid arthritis. ? Polio. ? Cerebral palsy.  Having family members who have bunions.  Being born with a foot deformity, such as flat feet or low arches.  Doing activities that put a lot of pressure on the feet, such as ballet dancing.  What are the signs or symptoms? The main symptom of a bunion is a noticeable bump on the big toe. Other symptoms may include:  Pain.  Swelling around the big toe.  Redness and inflammation.  Thick or hardened skin on the big toe or between the toes.  Stiffness or loss of motion in the big toe.  Trouble with walking.  How is this diagnosed? A bunion may be diagnosed based on your symptoms, medical history, and activities. You may have tests, such as:  X-rays. These allow your health care provider to check the position of the bones in your foot and look for damage to your joint. They also help your health care provider to determine the severity of your bunion and the best way to treat it.  Joint aspiration. In this test, a sample of fluid is removed from the toe joint. This test, which may be done if you are in a lot of pain, helps to rule out diseases that cause painful swelling of the joints, such as  arthritis.  How is this treated? There is no cure for a bunion, but treatment can help to prevent a bunion from getting worse. Treatment depends on the severity of your symptoms. Your health care provider may recommend:  Wearing shoes that have a wide toe box.  Using bunion pads to cushion the affected area.  Taping your toes together to keep them in a normal position.  Placing a device inside your shoe (orthotics) to help reduce pressure on your toe joint.  Taking medicine to ease pain, inflammation, and swelling.  Applying heat or ice to the affected area.  Doing stretching exercises.  Surgery to remove scar tissue and move the toes back into their normal position. This treatment is rare.  Follow these instructions at home:  Support your toe joint with proper footwear, shoe padding, or taping as told by your health care provider.  Take over-the-counter and prescription medicines only as told by your health care provider.  If directed, apply ice to the injured area: ? Put ice in a plastic bag. ? Place a towel between your skin and the bag. ? Leave the ice on for 20 minutes, 2-3 times per day.  If directed, apply heat to the affected area before you exercise. Use the heat source that your health care provider recommends, such as a moist heat pack or a heating pad. ? Place a towel between your   skin and the heat source. ? Leave the heat on for 20-30 minutes. ? Remove the heat if your skin turns bright red. This is especially important if you are unable to feel pain, heat, or cold. You may have a greater risk of getting burned.  Do exercises as told by your health care provider.  Keep all follow-up visits as told by your health care provider. Contact a health care provider if:  Your symptoms get worse.  Your symptoms do not improve in 2 weeks. Get help right away if:  You have severe pain and trouble with walking. This information is not intended to replace advice given  to you by your health care provider. Make sure you discuss any questions you have with your health care provider. Document Released: 03/23/2005 Document Revised: 08/29/2015 Document Reviewed: 10/21/2014 Elsevier Interactive Patient Education  2018 [employer]   Pre-Operative Instructions  Congratulations, you have decided to take an important step towards improving your quality of life.  You can be assured that the doctors and staff at [facility] will be with you every step of the way.  Here are some important things you should know:  Plan to be at the surgery center/hospital at least 1 (one) hour prior to your scheduled time, unless otherwise directed by the surgical center/hospital staff.  You must have a responsible adult accompany you, remain during the surgery and drive you home.  Make sure you have directions to the surgical center/hospital to ensure you arrive on time. If you are having surgery at [facility] or [facility], you will need a copy of your medical history and physical form from your family physician within one month prior to the date of surgery. We will give you a form for your primary physician to complete.  We make every effort to accommodate the date you request for surgery.  However, there are times where surgery dates or times have to be moved.  We will contact you as soon as possible if a change in schedule is required.   No aspirin/ibuprofen for one week before surgery.  If you are on aspirin, any non-steroidal anti-inflammatory medications (Mobic, Aleve, Ibuprofen) should not be taken seven (7) days prior to your surgery.  You make take Tylenol for pain prior to surgery.  Medications - If you are taking daily heart and blood pressure medications, seizure, reflux, allergy, asthma, anxiety, pain or diabetes medications, make sure you notify the surgery center/hospital before the day of surgery so they can tell you which medications you should take or avoid  the day of surgery. No food or drink after midnight the night before surgery unless directed otherwise by surgical center/hospital staff. No alcoholic beverages 24-hours prior to surgery.  No smoking 24-hours prior or 24-hours after surgery. Wear loose pants or shorts. They should be loose enough to fit over bandages, boots, and casts. Don't wear slip-on shoes. Sneakers are preferred. Bring your boot with you to the surgery center/hospital.  Also bring crutches or a walker if your physician has prescribed it for you.  If you do not have this equipment, it will be provided for you after surgery. If you have not been contacted by the surgery center/hospital by the day before your surgery, call to confirm the date and time of your surgery. Leave-time from work may vary depending on the type of surgery you have.  Appropriate arrangements should be made prior to surgery with your employer. Prescriptions will be provided immediately following   surgery by your doctor.  Fill these as soon as possible after surgery and take the medication as directed. Pain medications will not be refilled on weekends and must be approved by the doctor. Remove nail polish on the operative foot and avoid getting pedicures prior to surgery. Wash the night before surgery.  The night before surgery wash the foot and leg well with water and the antibacterial soap provided. Be sure to pay special attention to beneath the toenails and in between the toes.  Wash for at least three (3) minutes. Rinse thoroughly with water and dry well with a towel.  Perform this wash unless told not to do so by your physician.  Enclosed: 1 Ice pack (please put in freezer the night before surgery)   1 Hibiclens skin cleaner   Pre-op instructions  If you have any questions regarding the instructions, please do not hesitate to call our office.  [place]: [address], [place], [place] [address] -- [phone]  [place]: [address]  [place], [place] [address] -- [phone]  [place]: [address]  [place], [place] [address] -- [phone]  [place]: [address], [address], [place], [place] [address] -- [phone]  Website: [email or web address] 

## 2017-01-27 NOTE — Progress Notes (Signed)
Subjective:    Patient ID: Katherine Reese, female   DOB: 36 y.o.   MRN: [id number]   HPI patient states the right heel is really still bothering her and has not responded to conservative care and the bunion right is becoming increasingly sore despite wider shoes padding and soaks. Is doing very well from surgery on the left first metatarsal    ROS      Objective:  Physical Exam neurovascular status intact negative Homans sign noted with exquisite discomfort plantar aspect right heel at the insertional point structural bunion deformity right that's painful when pressed with redness and irritation upon palpation and well-healed surgical site left     Assessment:    Chronic plantar fasciitis right that's failed to respond to conservative care and structural bunion deformity right which is increasingly becoming symptomatic     Plan:     H&P condition reviewed and at this point discussed treatment options. Patient states she cannot take the pain and she would like to go ahead and get it corrected and I have recommended a fascial release right endoscopic and also structural bunion correction. Patient wants surgery and I allowed her to read consent form reviewing procedures that we will be required and all risk as outlined in the consent form. Patient is willing to accept risk of surgery and at this point after extensive review she signed consent form reviewing what we'll be done and all risk associated with it. Scheduled for outpatient surgery  X-rays indicate elevation of the intermetatarsal angle right of approximate 15 and moderate spur formation

## 2017-02-09 ENCOUNTER — Telehealth: Payer: Self-pay | Admitting: *Deleted

## 2017-02-09 ENCOUNTER — Encounter: Payer: Self-pay | Admitting: Podiatry

## 2017-02-09 DIAGNOSIS — M722 Plantar fascial fibromatosis: Secondary | ICD-10-CM | POA: Diagnosis not present

## 2017-02-09 DIAGNOSIS — M2011 Hallux valgus (acquired), right foot: Secondary | ICD-10-CM | POA: Diagnosis not present

## 2017-02-09 NOTE — Telephone Encounter (Signed)
Clyde CanterburyKendra - [facility] states pt has received percocet 10/325mg  and as written it is over the the new legal guidelines, they area able to fill for #21 Percocet 10/325 one every 8 hours prn. Dr. Everlena Cooperegal okayed the change.

## 2017-02-15 ENCOUNTER — Telehealth: Payer: Self-pay | Admitting: *Deleted

## 2017-02-15 NOTE — Progress Notes (Signed)
DOS 02/09/2017 Austin Bunionectomy RT and EPF RT

## 2017-02-15 NOTE — Telephone Encounter (Signed)
Left a voicemail message at [phone] (Cell #) to check to see how they were doing from when they had their surgery done on Tuesday, February 09, 2017 by Dr. Charlsie Merlesegal. [facility].  Pt's next appointment is on Wednesday, February 17, 2017 at 8:30 am. Pt is on the nurse schedule but will also see Dr. Charlsie Merlesegal when she is done.  Waiting for a response.

## 2017-02-17 ENCOUNTER — Ambulatory Visit (INDEPENDENT_AMBULATORY_CARE_PROVIDER_SITE_OTHER): Payer: Managed Care, Other (non HMO)

## 2017-02-17 ENCOUNTER — Encounter: Payer: Self-pay | Admitting: Podiatry

## 2017-02-17 ENCOUNTER — Ambulatory Visit (INDEPENDENT_AMBULATORY_CARE_PROVIDER_SITE_OTHER): Payer: Managed Care, Other (non HMO) | Admitting: Podiatry

## 2017-02-17 VITALS — BP 124/78 | HR 87 | Temp 97.8°F

## 2017-02-17 DIAGNOSIS — M21619 Bunion of unspecified foot: Secondary | ICD-10-CM | POA: Diagnosis not present

## 2017-02-17 DIAGNOSIS — M2011 Hallux valgus (acquired), right foot: Secondary | ICD-10-CM | POA: Diagnosis not present

## 2017-02-17 MED ORDER — OXYCODONE-ACETAMINOPHEN 10-325 MG PO TABS: 1.0000 | ORAL_TABLET | Freq: Four times a day (QID) | ORAL | 0 refills | Status: AC | PRN

## 2017-02-17 MED ORDER — OXYCODONE-ACETAMINOPHEN 10-325 MG PO TABS: 1.0000 | ORAL_TABLET | ORAL | 0 refills | Status: DC | PRN

## 2017-02-17 NOTE — Progress Notes (Signed)
Subjective:    Patient ID: Katherine Reese, female   DOB: 36 y.o.   MRN: [id number]   HPI patient doing well with quite a bit of discomfort if she does a lot of walking    ROS      Objective:  Physical Exam neurovascular status intact muscle strength adequate with patient's right foot showing well-healing surgical site plantar heel and first MPJ with wound edges well coapted stitches in place with negative Homans sign noted     Assessment:    Doing well post foot surgery right     Plan:    Reviewed condition and at its normal still have discomfort and at this point I recommended continued elevation immobilization compression and pain medicine as needed. Patient will be seen back again in 2 weeks or earlier if any issues were to occur and I reviewed x-rays with her  X-rays indicate osteotomy is healing very well with fixation in place and good alignment

## 2017-03-03 ENCOUNTER — Ambulatory Visit (INDEPENDENT_AMBULATORY_CARE_PROVIDER_SITE_OTHER): Payer: Managed Care, Other (non HMO) | Admitting: Podiatry

## 2017-03-03 ENCOUNTER — Encounter: Payer: Self-pay | Admitting: Podiatry

## 2017-03-03 ENCOUNTER — Ambulatory Visit (INDEPENDENT_AMBULATORY_CARE_PROVIDER_SITE_OTHER): Payer: Managed Care, Other (non HMO)

## 2017-03-03 DIAGNOSIS — M2011 Hallux valgus (acquired), right foot: Secondary | ICD-10-CM

## 2017-03-03 DIAGNOSIS — M21619 Bunion of unspecified foot: Secondary | ICD-10-CM

## 2017-03-03 NOTE — Progress Notes (Signed)
Subjective:    Patient ID: Katherine Reese, female   DOB: 36 y.o.   MRN: [id number]   HPI patient presents stating that she fell 2 days ago and she's had an increase in her heel pain and she is worried she might of broken something    ROS      Objective:  Physical Exam neurovascular status intact negative Homans sign was noted with moderate discomfort plantar heel right with poor foot structure looking good with mild edema     Assessment:    Possibility for trauma to the right foot secondary to injury with possibility for bone injury     Plan:    H&P x-rays reviewed and at this time patient may continue with boot continue range of motion and it should heal uneventfully but no this may throw her off by a couple weeks X-rays indicate no indications of pathology associated with the injury she experienced with pins in place no indication of movement

## 2017-03-24 ENCOUNTER — Encounter: Payer: Self-pay | Admitting: Podiatry

## 2017-03-24 ENCOUNTER — Ambulatory Visit (INDEPENDENT_AMBULATORY_CARE_PROVIDER_SITE_OTHER): Payer: Managed Care, Other (non HMO) | Admitting: Podiatry

## 2017-03-24 ENCOUNTER — Ambulatory Visit (INDEPENDENT_AMBULATORY_CARE_PROVIDER_SITE_OTHER): Payer: Managed Care, Other (non HMO)

## 2017-03-24 DIAGNOSIS — M21619 Bunion of unspecified foot: Secondary | ICD-10-CM | POA: Diagnosis not present

## 2017-03-24 DIAGNOSIS — M2011 Hallux valgus (acquired), right foot: Secondary | ICD-10-CM | POA: Diagnosis not present

## 2017-03-24 NOTE — Progress Notes (Signed)
Subjective:   Patient ID: Katherine Reese, female   DOB: 36 y.o.   MRN: [id number]   HPI Patient states doing well with surgery with good alignment and basically back to work wearing her boot with minimal discomfort   ROS      Objective:  Physical Exam  Neurovascular status intact negative Homans sign noted with wound edges well coapted hallux in rectus position good range of motion with good alignment of the first MPJ     Assessment:  Patient is doing well post osteotomy right with good alignment noted and good range of motion     Plan:  H&P x-rays reviewed and allow patient to increase activity and gradually to reduce surgical shoe boot usage as start to wear tennis shoe type devices.  Reappoint for us to recheck 4 weeks or earlier if needed  X-rays indicate osteotomy is healing well good alignment joint congruous fixation in place

## 2017-04-02 ENCOUNTER — Telehealth: Payer: Self-pay | Admitting: *Deleted

## 2017-04-02 NOTE — Telephone Encounter (Signed)
Pt states at last visit she was told she could return to work 04/07/2017 and needs a release letter, would like emailed to [email or web address].

## 2017-04-05 ENCOUNTER — Encounter: Payer: Self-pay | Admitting: *Deleted

## 2017-04-05 ENCOUNTER — Telehealth: Payer: Self-pay | Admitting: Podiatry

## 2017-04-05 NOTE — Telephone Encounter (Signed)
No problem.

## 2017-04-05 NOTE — Telephone Encounter (Signed)
Called pt to confirm her e-mail address as when I went to e-mail her the letter this morning it did not go through. Pt stated her e-mail address is [email or web address]. I told her that the "ow" was left out and that I would update it and resend her the letter via e-mail. Pt stated "thank you and have a Happy New Year."

## 2017-04-05 NOTE — Telephone Encounter (Addendum)
Letter emailed to [email or web address]. Left message informing pt the release to work was faxed to her email address.

## 2017-04-28 ENCOUNTER — Ambulatory Visit (INDEPENDENT_AMBULATORY_CARE_PROVIDER_SITE_OTHER): Payer: Managed Care, Other (non HMO)

## 2017-04-28 DIAGNOSIS — Z9889 Other specified postprocedural states: Secondary | ICD-10-CM

## 2017-04-28 DIAGNOSIS — M21619 Bunion of unspecified foot: Secondary | ICD-10-CM

## 2017-04-28 DIAGNOSIS — M2011 Hallux valgus (acquired), right foot: Secondary | ICD-10-CM

## 2017-04-28 DIAGNOSIS — M722 Plantar fascial fibromatosis: Secondary | ICD-10-CM
